# Patient Record
Sex: Female | Born: 1995 | Race: White | Hispanic: No | Marital: Single | Smoking: Never smoker
Health system: Southern US, Community
[De-identification: ages and names within clinical notes are randomized; demographics above are authoritative.]

## PROBLEM LIST (undated history)

## (undated) DIAGNOSIS — H7290 Unspecified perforation of tympanic membrane, unspecified ear: Secondary | ICD-10-CM

## (undated) DIAGNOSIS — F419 Anxiety disorder, unspecified: Secondary | ICD-10-CM

## (undated) DIAGNOSIS — F32A Depression, unspecified: Secondary | ICD-10-CM

---

## 2021-08-25 ENCOUNTER — Other Ambulatory Visit: Payer: Self-pay

## 2021-08-25 ENCOUNTER — Emergency Department (HOSPITAL_BASED_OUTPATIENT_CLINIC_OR_DEPARTMENT_OTHER)
Admission: EM | Admit: 2021-08-25 | Discharge: 2021-08-25 | Disposition: A | Discharge status: Home / Self Care | Payer: BC Managed Care – PPO | Attending: Emergency Medicine | Admitting: Emergency Medicine

## 2021-08-25 ENCOUNTER — Other Ambulatory Visit (HOSPITAL_BASED_OUTPATIENT_CLINIC_OR_DEPARTMENT_OTHER): Payer: Self-pay

## 2021-08-25 ENCOUNTER — Encounter (HOSPITAL_BASED_OUTPATIENT_CLINIC_OR_DEPARTMENT_OTHER): Payer: Self-pay

## 2021-08-25 ENCOUNTER — Emergency Department (HOSPITAL_BASED_OUTPATIENT_CLINIC_OR_DEPARTMENT_OTHER): Payer: BC Managed Care – PPO | Admitting: Radiology

## 2021-08-25 DIAGNOSIS — R079 Chest pain, unspecified: Secondary | ICD-10-CM | POA: Diagnosis present

## 2021-08-25 DIAGNOSIS — Z91040 Latex allergy status: Secondary | ICD-10-CM | POA: Diagnosis not present

## 2021-08-25 DIAGNOSIS — R0789 Other chest pain: Secondary | ICD-10-CM | POA: Diagnosis not present

## 2021-08-25 HISTORY — DX: Unspecified perforation of tympanic membrane, unspecified ear: H72.90

## 2021-08-25 HISTORY — DX: Anxiety disorder, unspecified: F41.9

## 2021-08-25 HISTORY — DX: Depression, unspecified: F32.A

## 2021-08-25 LAB — CBC WITH DIFFERENTIAL/PLATELET
Abs Immature Granulocytes: 0.03 10*3/uL (ref 0.00–0.07)
Basophils Absolute: 0.1 10*3/uL (ref 0.0–0.1)
Basophils Relative: 1 %
Eosinophils Absolute: 0.2 10*3/uL (ref 0.0–0.5)
Eosinophils Relative: 2 %
HCT: 45.9 % (ref 36.0–46.0)
Hemoglobin: 15.5 g/dL — ABNORMAL HIGH (ref 12.0–15.0)
Immature Granulocytes: 0 %
Lymphocytes Relative: 24 %
Lymphs Abs: 2.2 10*3/uL (ref 0.7–4.0)
MCH: 28 pg (ref 26.0–34.0)
MCHC: 33.8 g/dL (ref 30.0–36.0)
MCV: 82.9 fL (ref 80.0–100.0)
Monocytes Absolute: 0.7 10*3/uL (ref 0.1–1.0)
Monocytes Relative: 8 %
Neutro Abs: 6.1 10*3/uL (ref 1.7–7.7)
Neutrophils Relative %: 65 %
Platelets: 337 10*3/uL (ref 150–400)
RBC: 5.54 MIL/uL — ABNORMAL HIGH (ref 3.87–5.11)
RDW: 12.4 % (ref 11.5–15.5)
WBC: 9.4 10*3/uL (ref 4.0–10.5)
nRBC: 0 % (ref 0.0–0.2)

## 2021-08-25 LAB — BASIC METABOLIC PANEL
Anion gap: 10 (ref 5–15)
BUN: 12 mg/dL (ref 6–20)
CO2: 26 mmol/L (ref 22–32)
Calcium: 10.4 mg/dL — ABNORMAL HIGH (ref 8.9–10.3)
Chloride: 103 mmol/L (ref 98–111)
Creatinine, Ser: 0.92 mg/dL (ref 0.44–1.00)
GFR, Estimated: >60 mL/min (ref >60)
Glucose, Bld: 105 mg/dL — ABNORMAL HIGH (ref 70–99)
Potassium: 4.1 mmol/L (ref 3.5–5.1)
Sodium: 139 mmol/L (ref 135–145)

## 2021-08-25 LAB — TROPONIN I (HIGH SENSITIVITY): Troponin I (High Sensitivity): <2 ng/L (ref <18)

## 2021-08-25 MED ORDER — IBUPROFEN 600 MG PO TABS
600.0000 mg | ORAL_TABLET | Freq: Three times a day (TID) | ORAL | 0 refills | Status: AC | PRN
  Filled 2021-08-25: qty 30, 10d supply, fill #0

## 2021-08-25 NOTE — ED Provider Notes (Signed)
[facility] EMERGENCY DEPARTMENT Provider Note  CSN: [id number] Arrival date & time: 08/25/21 0907    History Chief Complaint  Patient presents with   Chest Pain    Wendy Bruce is a 25 y.o. female with no significant PMH reports she noticed some aching pain in L arm about 2-3 days ago, began to have some aching pain in L upper chest, arm pain has resolved but chest pain has continued. No fever, cough SOB. No injury. No provoking or relieving factors.    Past Medical History:  Diagnosis Date   Anxiety    Depression    Ruptured ear drum    left ear, states has had since 2019    History reviewed. No pertinent surgical history.  No family history on file.  Social History   Tobacco Use   Smoking status: Never   Smokeless tobacco: Never  Vaping Use   Vaping Use: Never used  Substance Use Topics   Alcohol use: Yes    Comment: very rarely   Drug use: Never     Home Medications Prior to Admission medications   Medication Sig Start Date End Date Taking? Authorizing Provider  acetaminophen (TYLENOL) 325 MG tablet Take 650 mg by mouth every 6 (six) hours as needed.   Yes [provider]  Melatonin 2.5 MG CHEW Chew 5 mg by mouth at bedtime.   Yes [provider]  POTASSIUM PO Take 99 mg by mouth daily.   Yes [provider]  VITAMIN D PO Take 1,000 mg by mouth daily.   Yes [provider]  ibuprofen (ADVIL) 600 MG tablet Take 1 tablet (600 mg total) by mouth every 8 (eight) hours as needed for moderate pain. 08/25/21   Pollyann Savoy, MD     Allergies    Codeine, Latex, Penicillins, and Sulfa antibiotics   Review of Systems   Review of Systems A comprehensive review of systems was completed and negative except as noted in HPI.    Physical Exam BP 136/86 (BP Location: Right Arm)   Pulse 89   Temp 99 F (37.2 C) (Oral)   Resp 13   Ht 5\' 6"  (1.676 m)   Wt (!) 142.9 kg   LMP 07/08/2021 (Approximate)   SpO2 98%    BMI 50.84 kg/m   Physical Exam Vitals and nursing note reviewed.  Constitutional:      Appearance: Normal appearance.  HENT:     Head: Normocephalic and atraumatic.     Nose: Nose normal.     Mouth/Throat:     Mouth: Mucous membranes are moist.  Eyes:     Extraocular Movements: Extraocular movements intact.     Conjunctiva/sclera: Conjunctivae normal.  Cardiovascular:     Rate and Rhythm: Normal rate.  Pulmonary:     Effort: Pulmonary effort is normal.     Breath sounds: Normal breath sounds.  Chest:     Chest wall: Tenderness (L upper, reproduces pain) present.  Abdominal:     General: Abdomen is flat.     Palpations: Abdomen is soft.     Tenderness: There is no abdominal tenderness.  Musculoskeletal:        General: No swelling. Normal range of motion.     Cervical back: Neck supple.  Skin:    General: Skin is warm and dry.  Neurological:     General: No focal deficit present.     Mental Status: She is alert.  Psychiatric:  Mood and Affect: Mood normal.     ED Results / Procedures / Treatments   Labs (all labs ordered are listed, but only abnormal results are displayed) Labs Reviewed  BASIC METABOLIC PANEL - Abnormal; Notable for the following components:      Result Value   Glucose, Bld 105 (*)    Calcium 10.4 (*)    All other components within normal limits  CBC WITH DIFFERENTIAL/PLATELET - Abnormal; Notable for the following components:   RBC 5.54 (*)    Hemoglobin 15.5 (*)    All other components within normal limits  TROPONIN I (HIGH SENSITIVITY)    EKG EKG Interpretation  Date/Time:  Wednesday August 25 2021 09:16:29 EDT Ventricular Rate:  74 PR Interval:  145 QRS Duration: 83 QT Interval:  395 QTC Calculation: 439 R Axis:   63 Text Interpretation: Sinus rhythm Low voltage, precordial leads No old tracing to compare Confirmed by Susy Frizzle [phone]) on 08/25/2021 9:19:20 AM   Radiology DG Chest 2 View  Result Date:  08/25/2021 CLINICAL DATA:  Chest pain EXAM: CHEST - 2 VIEW COMPARISON:  None. FINDINGS: Heart size and mediastinal contours are within normal limits. No suspicious pulmonary opacities identified. No pleural effusion or pneumothorax visualized. No acute osseous abnormality appreciated. IMPRESSION: No acute intrathoracic process identified. Electronically Signed   By: Jannifer Hick M.D.   On: 08/25/2021 10:20    Procedures Procedures  Medications Ordered in the ED Medications - No data to display   MDM Rules/Calculators/A&P MDM  Patient with atypical reproducible chest pain ongoing for 2 days. Will check labs, including single trop, CXR. EKG is unremarkable.   ED Course  I have reviewed the triage vital signs and the nursing notes.  Pertinent labs & imaging results that were available during my care of the patient were reviewed by me and considered in my medical decision making (see chart for details).  Clinical Course as of 08/25/21 1050  Wed Aug 25, 2021  1003 CBC is unremarkable.  [CS]  1028 BMP is normal.  [CS]  1029 CXR is clear [CS]  1033 Trop is neg, given duration of symptoms, delta is not necessary.  [CS]  1048 Patient with atypical, likely MSK chest pain has a neg ED workup. Recommend NSAID for discomfort. Rest and PCP follow up. RTED for any other concerns.  [CS]    Clinical Course User Index [CS] Pollyann Savoy, MD    Final Clinical Impression(s) / ED Diagnoses Final diagnoses:  Chest wall pain    Rx / DC Orders ED Discharge Orders          Ordered    ibuprofen (ADVIL) 600 MG tablet  Every 8 hours PRN        08/25/21 1049             Pollyann Savoy, MD 08/25/21 1050

## 2021-08-25 NOTE — ED Triage Notes (Signed)
Pt presents POV with c/o of 2 day history of left upper chest pain.  States initially had left arm pain, which has resolved but continues to have chest pain.  Reports nausea/vomiting.  Denies shortness of breath.

## 2022-10-29 IMAGING — DX DG CHEST 2V
2 series · 2 of 2 positions shown · non-contrast
Comparison: None.

CLINICAL DATA: Chest pain

EXAM:
CHEST - 2 VIEW

[chest pa]
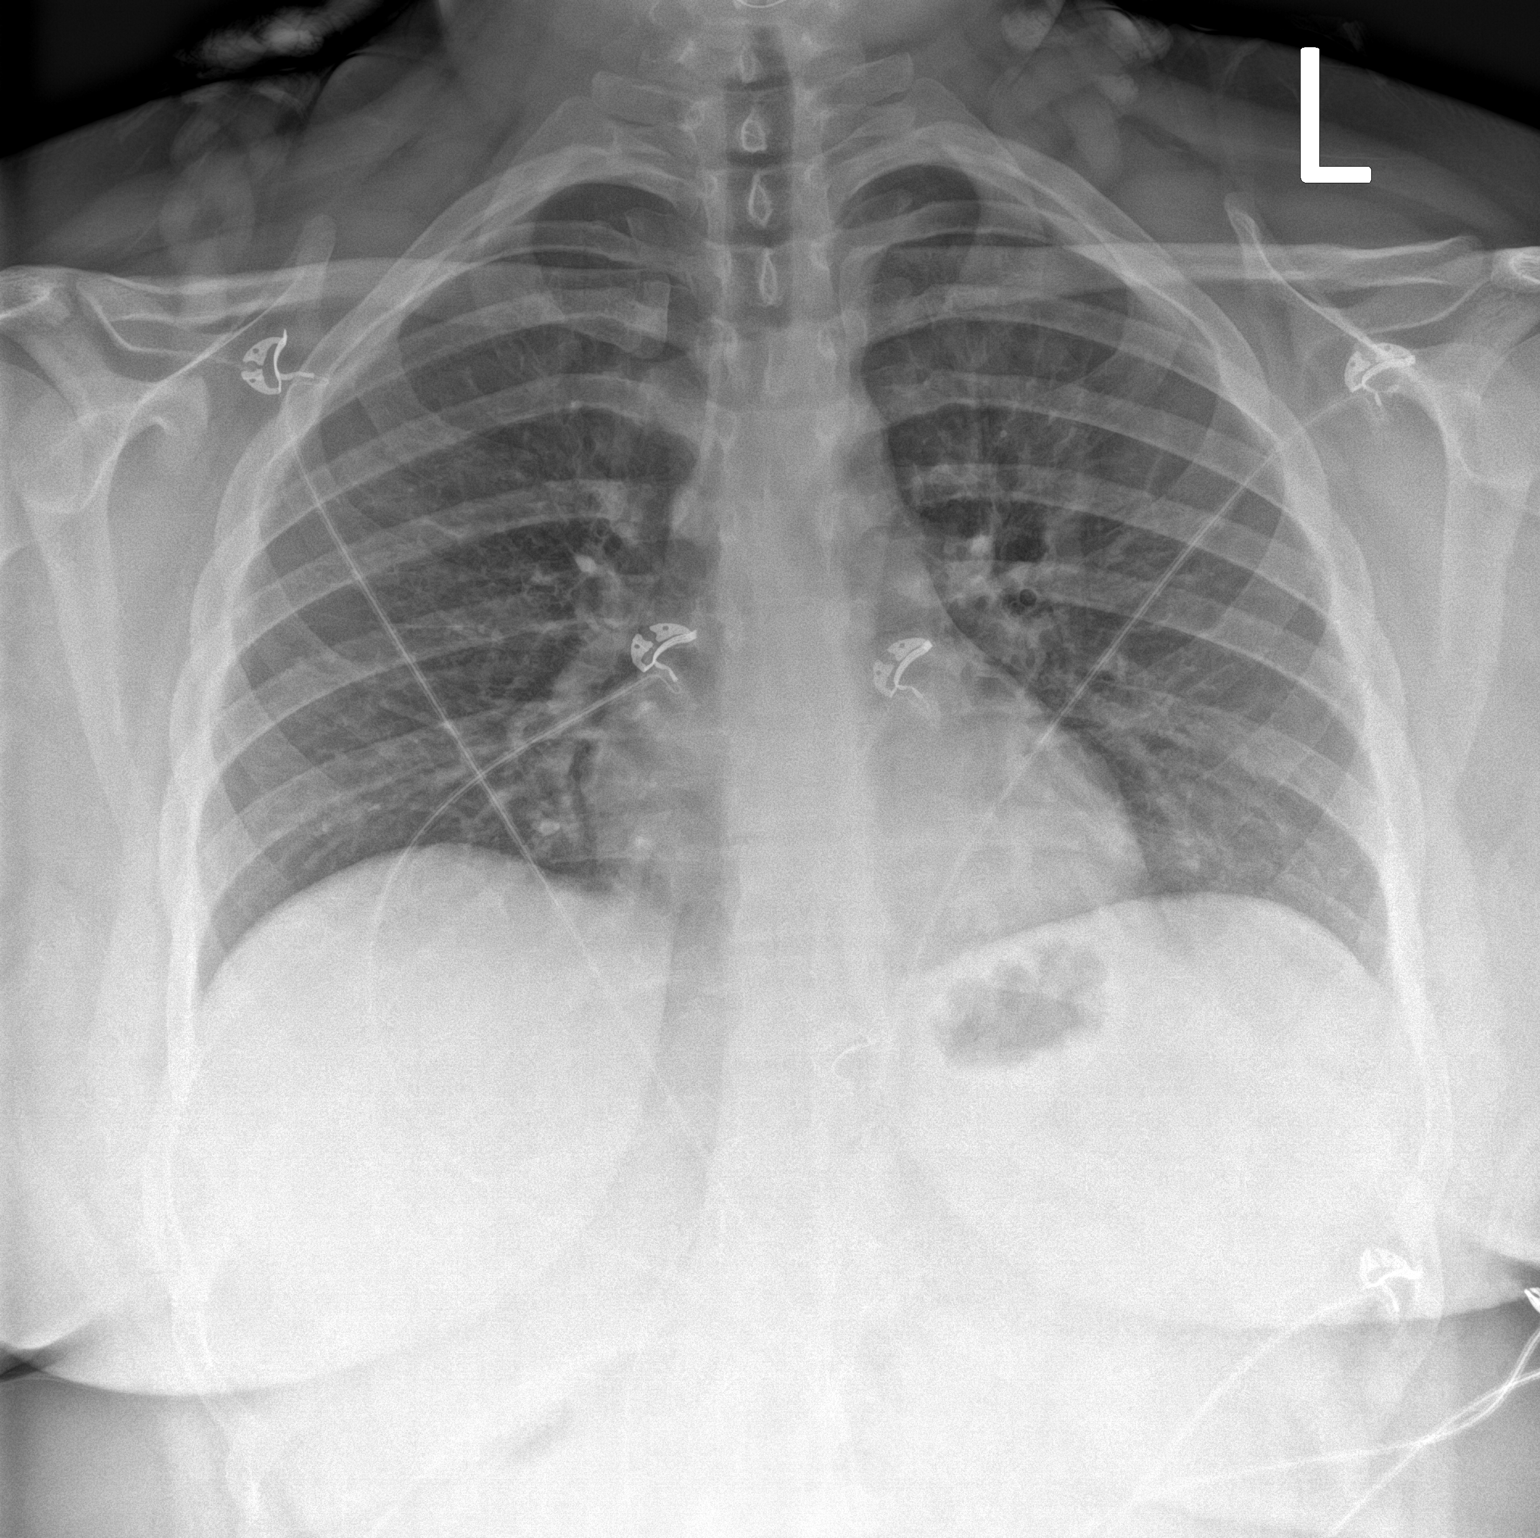

[chest lat]
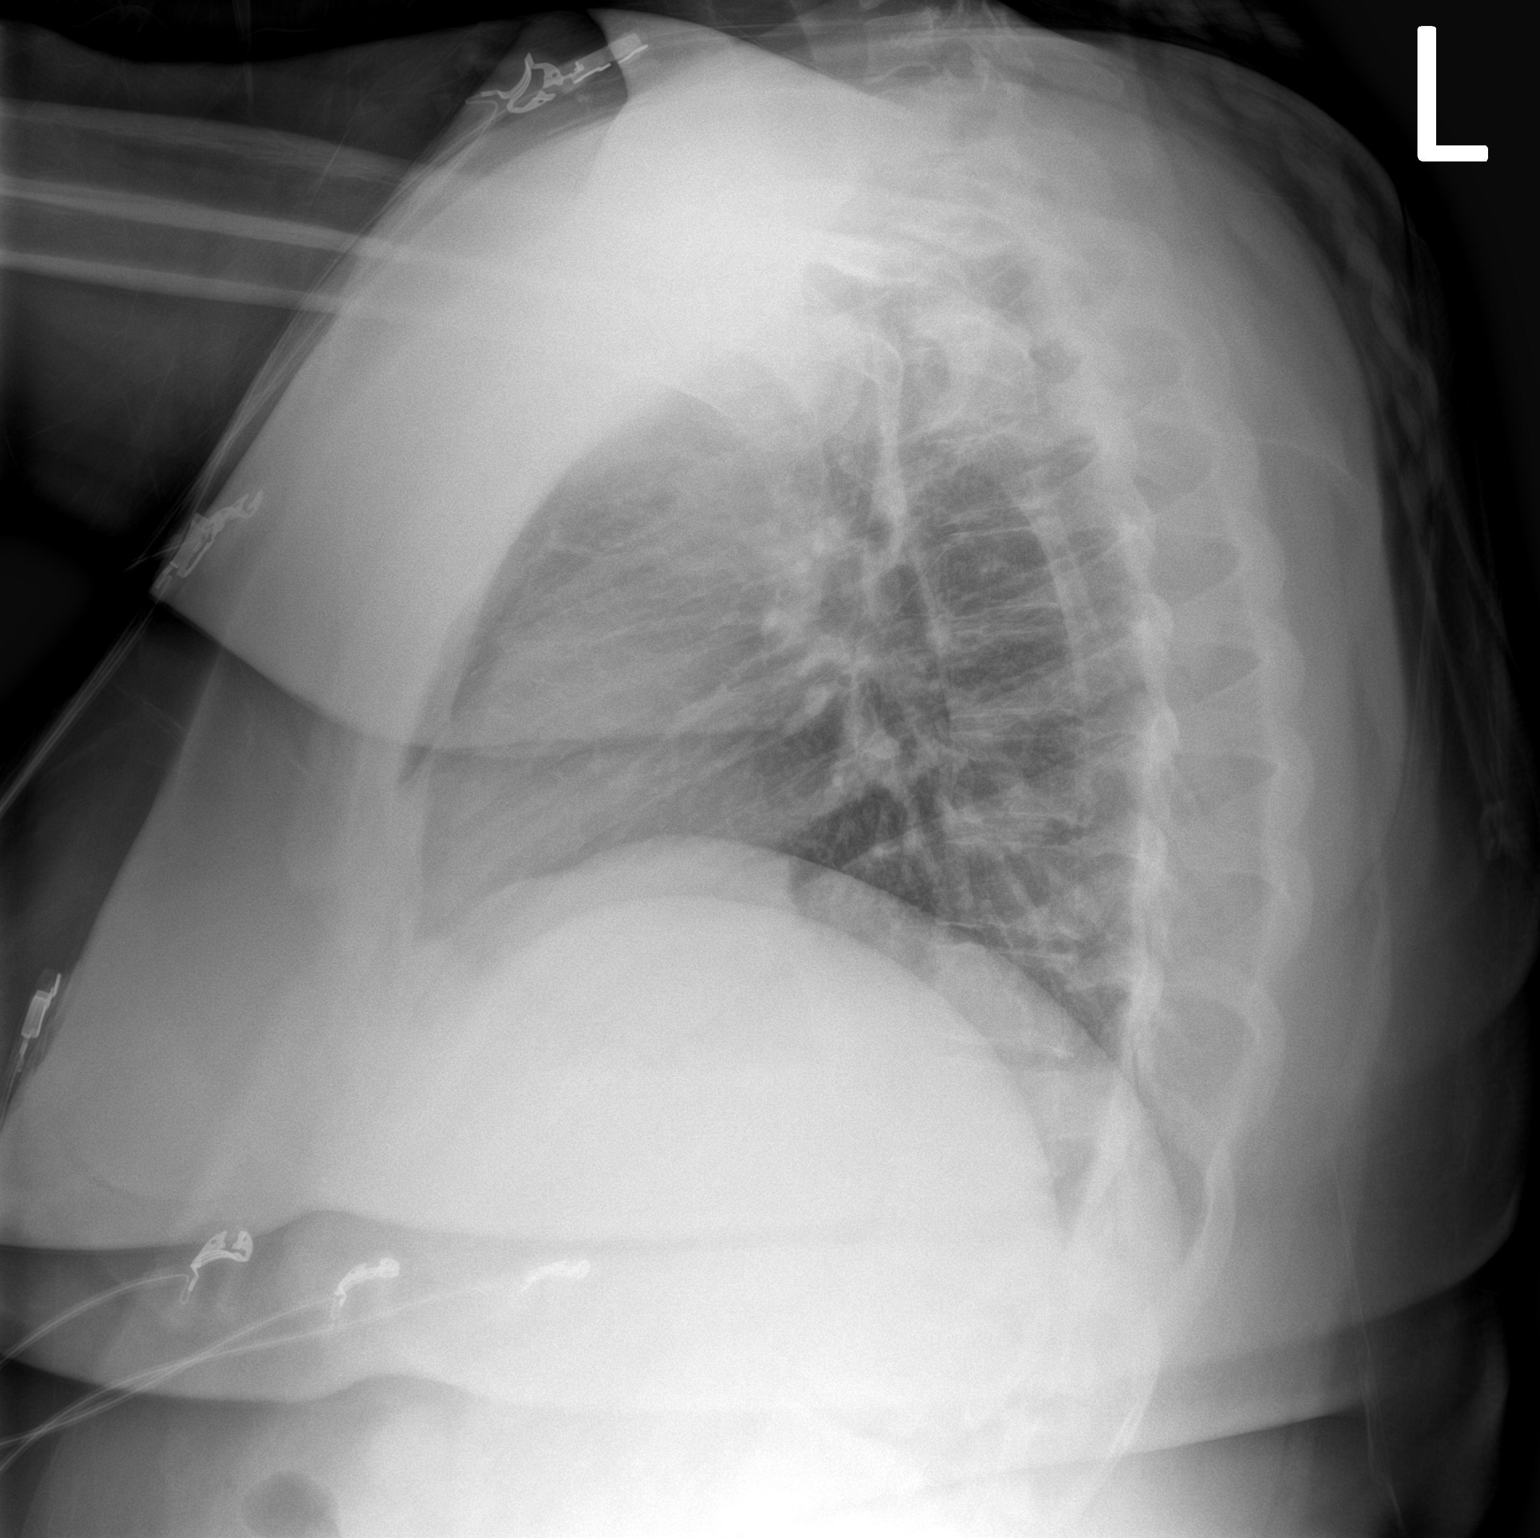

[2 of 2 positions shown; findings below may reference images not displayed]

FINDINGS: Heart size and mediastinal contours are within normal limits. No
suspicious pulmonary opacities identified.

No pleural effusion or pneumothorax visualized.

No acute osseous abnormality appreciated.
IMPRESSION: No acute intrathoracic process identified.
